# Patient Record
Sex: Female | Born: 1965 | Race: White | Hispanic: No | Marital: Single | Smoking: Current every day smoker
Health system: Southern US, Community
[De-identification: ages and names within clinical notes are randomized; demographics above are authoritative.]

## PROBLEM LIST (undated history)

## (undated) DIAGNOSIS — E039 Hypothyroidism, unspecified: Secondary | ICD-10-CM

## (undated) DIAGNOSIS — R51 Headache: Secondary | ICD-10-CM

## (undated) DIAGNOSIS — J189 Pneumonia, unspecified organism: Secondary | ICD-10-CM

## (undated) DIAGNOSIS — B958 Unspecified staphylococcus as the cause of diseases classified elsewhere: Secondary | ICD-10-CM

## (undated) DIAGNOSIS — I82409 Acute embolism and thrombosis of unspecified deep veins of unspecified lower extremity: Secondary | ICD-10-CM

## (undated) DIAGNOSIS — E0500 Thyrotoxicosis with diffuse goiter without thyrotoxic crisis or storm: Secondary | ICD-10-CM

## (undated) DIAGNOSIS — Z9289 Personal history of other medical treatment: Secondary | ICD-10-CM

## (undated) HISTORY — PX: TUBAL LIGATION: SHX77

## (undated) HISTORY — PX: COLON RESECTION: SHX5231

---

## 1994-02-16 HISTORY — PX: OTHER SURGICAL HISTORY: SHX169

## 2006-02-16 HISTORY — PX: APPENDECTOMY: SHX54

## 2007-02-17 DIAGNOSIS — I82409 Acute embolism and thrombosis of unspecified deep veins of unspecified lower extremity: Secondary | ICD-10-CM

## 2007-02-17 DIAGNOSIS — B958 Unspecified staphylococcus as the cause of diseases classified elsewhere: Secondary | ICD-10-CM

## 2007-02-17 HISTORY — DX: Acute embolism and thrombosis of unspecified deep veins of unspecified lower extremity: I82.409

## 2007-02-17 HISTORY — DX: Unspecified staphylococcus as the cause of diseases classified elsewhere: B95.8

## 2008-02-17 DIAGNOSIS — J189 Pneumonia, unspecified organism: Secondary | ICD-10-CM

## 2008-02-17 HISTORY — DX: Pneumonia, unspecified organism: J18.9

## 2013-01-31 ENCOUNTER — Other Ambulatory Visit: Payer: Self-pay | Admitting: Orthopedic Surgery

## 2013-02-20 ENCOUNTER — Encounter (HOSPITAL_COMMUNITY): Payer: Self-pay

## 2013-02-21 ENCOUNTER — Encounter (HOSPITAL_COMMUNITY): Payer: Self-pay

## 2013-02-21 ENCOUNTER — Encounter (HOSPITAL_COMMUNITY)
Admission: RE | Admit: 2013-02-21 | Discharge: 2013-02-21 | Disposition: A | Discharge status: Home / Self Care | Payer: Worker's Compensation | Source: Ambulatory Visit | Attending: Orthopedic Surgery | Admitting: Orthopedic Surgery

## 2013-02-21 DIAGNOSIS — Z01810 Encounter for preprocedural cardiovascular examination: Secondary | ICD-10-CM | POA: Insufficient documentation

## 2013-02-21 DIAGNOSIS — Z01818 Encounter for other preprocedural examination: Secondary | ICD-10-CM | POA: Insufficient documentation

## 2013-02-21 DIAGNOSIS — M6289 Other specified disorders of muscle: Secondary | ICD-10-CM | POA: Diagnosis present

## 2013-02-21 DIAGNOSIS — Z01812 Encounter for preprocedural laboratory examination: Secondary | ICD-10-CM | POA: Insufficient documentation

## 2013-02-21 HISTORY — DX: Headache: R51

## 2013-02-21 HISTORY — DX: Hypothyroidism, unspecified: E03.9

## 2013-02-21 HISTORY — DX: Thyrotoxicosis with diffuse goiter without thyrotoxic crisis or storm: E05.00

## 2013-02-21 HISTORY — DX: Personal history of other medical treatment: Z92.89

## 2013-02-21 HISTORY — DX: Pneumonia, unspecified organism: J18.9

## 2013-02-21 HISTORY — DX: Acute embolism and thrombosis of unspecified deep veins of unspecified lower extremity: I82.409

## 2013-02-21 HISTORY — DX: Unspecified staphylococcus as the cause of diseases classified elsewhere: B95.8

## 2013-02-21 LAB — HCG, SERUM, QUALITATIVE: PREG SERUM: NEGATIVE

## 2013-02-21 LAB — COMPREHENSIVE METABOLIC PANEL
ALBUMIN: 3.9 g/dL (ref 3.5–5.2)
ALT: 8 U/L (ref 0–35)
AST: 12 U/L (ref 0–37)
Alkaline Phosphatase: 48 U/L (ref 39–117)
BUN: 9 mg/dL (ref 6–23)
CO2: 24 mEq/L (ref 19–32)
Calcium: 9.5 mg/dL (ref 8.4–10.5)
Chloride: 102 mEq/L (ref 96–112)
Creatinine, Ser: 0.82 mg/dL (ref 0.50–1.10)
GFR calc Af Amer: >90 mL/min (ref >90)
GFR calc non Af Amer: 85 mL/min — ABNORMAL LOW (ref >90)
Glucose, Bld: 94 mg/dL (ref 70–99)
POTASSIUM: 4.9 meq/L (ref 3.7–5.3)
Sodium: 140 mEq/L (ref 137–147)
Total Bilirubin: 0.3 mg/dL (ref 0.3–1.2)
Total Protein: 7.5 g/dL (ref 6.0–8.3)

## 2013-02-21 LAB — URINALYSIS, ROUTINE W REFLEX MICROSCOPIC
BILIRUBIN URINE: NEGATIVE
Glucose, UA: NEGATIVE mg/dL
Hgb urine dipstick: NEGATIVE
Ketones, ur: NEGATIVE mg/dL
LEUKOCYTES UA: NEGATIVE
NITRITE: NEGATIVE
PH: 7 (ref 5.0–8.0)
Protein, ur: NEGATIVE mg/dL
Specific Gravity, Urine: 1.011 (ref 1.005–1.030)
Urobilinogen, UA: 1 mg/dL (ref 0.0–1.0)

## 2013-02-21 LAB — CBC WITH DIFFERENTIAL/PLATELET
BASOS PCT: 0 % (ref 0–1)
Basophils Absolute: 0 10*3/uL (ref 0.0–0.1)
Eosinophils Absolute: 0.1 10*3/uL (ref 0.0–0.7)
Eosinophils Relative: 1 % (ref 0–5)
HCT: 39.7 % (ref 36.0–46.0)
HEMOGLOBIN: 12.8 g/dL (ref 12.0–15.0)
LYMPHS ABS: 3.6 10*3/uL (ref 0.7–4.0)
Lymphocytes Relative: 35 % (ref 12–46)
MCH: 24.8 pg — ABNORMAL LOW (ref 26.0–34.0)
MCHC: 32.2 g/dL (ref 30.0–36.0)
MCV: 76.9 fL — ABNORMAL LOW (ref 78.0–100.0)
MONOS PCT: 5 % (ref 3–12)
Monocytes Absolute: 0.5 10*3/uL (ref 0.1–1.0)
NEUTROS PCT: 59 % (ref 43–77)
Neutro Abs: 6.1 10*3/uL (ref 1.7–7.7)
Platelets: 324 10*3/uL (ref 150–400)
RBC: 5.16 MIL/uL — AB (ref 3.87–5.11)
RDW: 16.6 % — ABNORMAL HIGH (ref 11.5–15.5)
WBC: 10.3 10*3/uL (ref 4.0–10.5)

## 2013-02-21 LAB — ABO/RH: ABO/RH(D): B POS

## 2013-02-21 LAB — TYPE AND SCREEN
ABO/RH(D): B POS
Antibody Screen: NEGATIVE

## 2013-02-21 LAB — PROTIME-INR
INR: 0.98 (ref 0.00–1.49)
PROTHROMBIN TIME: 12.8 s (ref 11.6–15.2)

## 2013-02-21 LAB — SURGICAL PCR SCREEN
MRSA, PCR: NEGATIVE
STAPHYLOCOCCUS AUREUS: NEGATIVE

## 2013-02-21 LAB — APTT: APTT: 30 s (ref 24–37)

## 2013-02-21 NOTE — Pre-Procedure Instructions (Signed)
Regina HeckYvonne Knight  02/21/2013   Your procedure is scheduled on:  Thursday, January 15.  [facility]/Entrance "A"at 9:00 AM.  Call this number if you have problems the morning of surgery: [phone]   Remember:   Do not eat food or drink liquids after midnight, Wednesday, January 14.   Take these medicines the morning of surgery with A SIP OF WATER: Nexium, Synthyroid.  May take Tramadol if needed.   Do not wear jewelry, make-up or nail polish.  Do not wear lotions, powders, or perfumes. You may wear deodorant.  Do not shave 48 hours prior to surgery. Men may shave face and neck.  Do not bring valuables to the hospital.  [facility] not [facility]                  for any belongings or valuables.               Contacts, dentures or bridgework may not be worn into surgery.  Leave suitcase in the car. After surgery it may be brought to your room.  For patients admitted to the hospital, discharge time is determined by your treatment team.               Patients discharged the day of surgery will not be allowed to drive home.  Name and phone number of your driver: -   Special Instructions: Shower using CHG 2 nights before surgery and the night before surgery.  If you shower the day of surgery use CHG.  Use special wash - you have one bottle of CHG for all showers.  You should use approximately 1/3 of the bottle for each shower.   Please read over the following fact sheets that you were given: Pain Booklet, Coughing and Deep Breathing and Surgical Site Infection Prevention

## 2013-02-23 NOTE — Progress Notes (Signed)
Anesthesia Chart Review:  Patient is a 48 year old female scheduled for C4-5, C5-6 ACDF on 03/02/13 by Dr. Yevette Edwardsumonski. History includes obesity, smoking, Graves' disease, hypothyroidism on levothyroxine, DVT (upper arm; associated to IV access) '09, headaches, colon resection (not specified), appendectomy, staph PNA '10, history of blood transfusion. No PCP is listed.  EKG on 02/21/13 showed NSR, possible anterior infarct (age undetermined).  Currently, there are no comparison EKGs available. No CV symptoms were documented at her PAT visit.  She has no reported history of HTN, DM, MI, or CHF.  Preoperative CXR and labs noted.   She will be evaluated by her assigned anesthesiologist on the day of surgery. If no acute changes or new CV symptoms then I would anticipate that she could proceed as planned.  Velna Ochsllison Solan Vosler, PA-C [facility]/Anesthesiology Phone [phone] 02/23/2013 2:06 PM

## 2013-03-01 MED ORDER — CEFAZOLIN SODIUM-DEXTROSE 2-3 GM-% IV SOLR
2.0000 g | INTRAVENOUS | Status: AC
  Administered 2013-03-02: 2 g via INTRAVENOUS
  Filled 2013-03-01: qty 50

## 2013-03-01 NOTE — Progress Notes (Signed)
Call to pt. & instructed on time change & arrival time to be 1100am on 03/02/2013

## 2013-03-02 ENCOUNTER — Ambulatory Visit (HOSPITAL_COMMUNITY): Payer: Worker's Compensation | Admitting: Anesthesiology

## 2013-03-02 ENCOUNTER — Inpatient Hospital Stay (HOSPITAL_COMMUNITY): Payer: Worker's Compensation

## 2013-03-02 ENCOUNTER — Encounter (HOSPITAL_COMMUNITY): Payer: Self-pay | Admitting: Anesthesiology

## 2013-03-02 ENCOUNTER — Other Ambulatory Visit: Payer: Self-pay | Admitting: Orthopedic Surgery

## 2013-03-02 ENCOUNTER — Ambulatory Visit (HOSPITAL_COMMUNITY)
Admission: RE | Admit: 2013-03-02 | Discharge: 2013-03-03 | Disposition: A | Discharge status: Home / Self Care | Payer: Worker's Compensation | Source: Ambulatory Visit | Attending: Orthopedic Surgery | Admitting: Orthopedic Surgery

## 2013-03-02 ENCOUNTER — Encounter (HOSPITAL_COMMUNITY)
Admission: RE | Disposition: A | Discharge status: Home / Self Care | Payer: Self-pay | Source: Ambulatory Visit | Attending: Orthopedic Surgery

## 2013-03-02 ENCOUNTER — Encounter (HOSPITAL_COMMUNITY): Payer: Worker's Compensation | Admitting: Vascular Surgery

## 2013-03-02 DIAGNOSIS — E039 Hypothyroidism, unspecified: Secondary | ICD-10-CM | POA: Insufficient documentation

## 2013-03-02 DIAGNOSIS — F17200 Nicotine dependence, unspecified, uncomplicated: Secondary | ICD-10-CM | POA: Insufficient documentation

## 2013-03-02 DIAGNOSIS — M4712 Other spondylosis with myelopathy, cervical region: Principal | ICD-10-CM | POA: Insufficient documentation

## 2013-03-02 DIAGNOSIS — Z86718 Personal history of other venous thrombosis and embolism: Secondary | ICD-10-CM | POA: Insufficient documentation

## 2013-03-02 DIAGNOSIS — E0500 Thyrotoxicosis with diffuse goiter without thyrotoxic crisis or storm: Secondary | ICD-10-CM | POA: Insufficient documentation

## 2013-03-02 DIAGNOSIS — M5410 Radiculopathy, site unspecified: Secondary | ICD-10-CM | POA: Diagnosis present

## 2013-03-02 HISTORY — PX: ANTERIOR CERVICAL DECOMP/DISCECTOMY FUSION: SHX1161

## 2013-03-02 SURGERY — ANTERIOR CERVICAL DECOMPRESSION/DISCECTOMY FUSION 2 LEVELS
Anesthesia: General | Site: Spine Cervical

## 2013-03-02 MED ORDER — SODIUM CHLORIDE 0.9 % IJ SOLN
3.0000 mL | Freq: Two times a day (BID) | INTRAMUSCULAR | Status: DC
  Administered 2013-03-02: 3 mL via INTRAVENOUS

## 2013-03-02 MED ORDER — SODIUM CHLORIDE 0.9 % IJ SOLN: 3.0000 mL | INTRAMUSCULAR | Status: DC | PRN

## 2013-03-02 MED ORDER — OXYCODONE-ACETAMINOPHEN 5-325 MG PO TABS
ORAL_TABLET | ORAL | Status: AC
  Filled 2013-03-02: qty 2

## 2013-03-02 MED ORDER — BUPIVACAINE-EPINEPHRINE 0.25% -1:200000 IJ SOLN
INTRAMUSCULAR | Status: DC | PRN
  Administered 2013-03-02: 2 mL

## 2013-03-02 MED ORDER — OXYCODONE-ACETAMINOPHEN 5-325 MG PO TABS
1.0000 | ORAL_TABLET | ORAL | Status: DC | PRN
  Administered 2013-03-02 – 2013-03-03 (×5): 2 via ORAL
  Filled 2013-03-02 (×4): qty 2

## 2013-03-02 MED ORDER — SENNOSIDES-DOCUSATE SODIUM 8.6-50 MG PO TABS
1.0000 | ORAL_TABLET | Freq: Every evening | ORAL | Status: DC | PRN
  Filled 2013-03-02: qty 1

## 2013-03-02 MED ORDER — HYDROMORPHONE HCL PF 1 MG/ML IJ SOLN
0.2500 mg | INTRAMUSCULAR | Status: DC | PRN
  Administered 2013-03-02: 0.5 mg via INTRAVENOUS

## 2013-03-02 MED ORDER — FLEET ENEMA 7-19 GM/118ML RE ENEM: 1.0000 | ENEMA | Freq: Once | RECTAL | Status: AC | PRN

## 2013-03-02 MED ORDER — DIAZEPAM 5 MG PO TABS
5.0000 mg | ORAL_TABLET | Freq: Four times a day (QID) | ORAL | Status: DC | PRN
  Administered 2013-03-02 – 2013-03-03 (×3): 5 mg via ORAL
  Filled 2013-03-02 (×2): qty 1

## 2013-03-02 MED ORDER — BUPIVACAINE-EPINEPHRINE (PF) 0.25% -1:200000 IJ SOLN
INTRAMUSCULAR | Status: AC
  Filled 2013-03-02: qty 30

## 2013-03-02 MED ORDER — MENTHOL 3 MG MT LOZG
1.0000 | LOZENGE | OROMUCOSAL | Status: DC | PRN
  Administered 2013-03-03: 3 mg via ORAL
  Filled 2013-03-02 (×2): qty 9

## 2013-03-02 MED ORDER — LEVOTHYROXINE SODIUM 150 MCG PO TABS
150.0000 ug | ORAL_TABLET | Freq: Every day | ORAL | Status: DC
  Administered 2013-03-03: 150 ug via ORAL
  Filled 2013-03-02 (×2): qty 1

## 2013-03-02 MED ORDER — POVIDONE-IODINE 7.5 % EX SOLN
Freq: Once | CUTANEOUS | Status: DC
  Filled 2013-03-02: qty 118

## 2013-03-02 MED ORDER — DOCUSATE SODIUM 100 MG PO CAPS
100.0000 mg | ORAL_CAPSULE | Freq: Two times a day (BID) | ORAL | Status: DC
  Administered 2013-03-02 (×2): 100 mg via ORAL
  Filled 2013-03-02 (×4): qty 1

## 2013-03-02 MED ORDER — CEFAZOLIN SODIUM 1-5 GM-% IV SOLN
1.0000 g | Freq: Three times a day (TID) | INTRAVENOUS | Status: AC
  Administered 2013-03-02 – 2013-03-03 (×2): 1 g via INTRAVENOUS
  Filled 2013-03-02 (×2): qty 50

## 2013-03-02 MED ORDER — GLYCOPYRROLATE 0.2 MG/ML IJ SOLN
INTRAMUSCULAR | Status: DC | PRN
  Administered 2013-03-02: 0.6 mg via INTRAVENOUS

## 2013-03-02 MED ORDER — LACTATED RINGERS IV SOLN
INTRAVENOUS | Status: DC
  Administered 2013-03-02: 11:00:00 via INTRAVENOUS

## 2013-03-02 MED ORDER — LIDOCAINE HCL (CARDIAC) 20 MG/ML IV SOLN
INTRAVENOUS | Status: DC | PRN
  Administered 2013-03-02: 100 mg via INTRAVENOUS

## 2013-03-02 MED ORDER — MIDAZOLAM HCL 5 MG/5ML IJ SOLN
INTRAMUSCULAR | Status: DC | PRN
  Administered 2013-03-02: 2 mg via INTRAVENOUS

## 2013-03-02 MED ORDER — ZOLPIDEM TARTRATE 5 MG PO TABS
5.0000 mg | ORAL_TABLET | Freq: Every evening | ORAL | Status: DC | PRN
Start: 2013-03-02 — End: 2013-03-03

## 2013-03-02 MED ORDER — THROMBIN 20000 UNITS EX SOLR
CUTANEOUS | Status: AC
  Filled 2013-03-02: qty 20000

## 2013-03-02 MED ORDER — ONDANSETRON HCL 4 MG/2ML IJ SOLN: 4.0000 mg | Freq: Once | INTRAMUSCULAR | Status: DC | PRN

## 2013-03-02 MED ORDER — FENTANYL CITRATE 0.05 MG/ML IJ SOLN
INTRAMUSCULAR | Status: DC | PRN
  Administered 2013-03-02 (×2): 50 ug via INTRAVENOUS
  Administered 2013-03-02: 150 ug via INTRAVENOUS
  Administered 2013-03-02 (×2): 50 ug via INTRAVENOUS

## 2013-03-02 MED ORDER — ACETAMINOPHEN 325 MG PO TABS: 650.0000 mg | ORAL_TABLET | ORAL | Status: DC | PRN

## 2013-03-02 MED ORDER — BISACODYL 5 MG PO TBEC
5.0000 mg | DELAYED_RELEASE_TABLET | Freq: Every day | ORAL | Status: DC | PRN
  Filled 2013-03-02: qty 1

## 2013-03-02 MED ORDER — PHENOL 1.4 % MT LIQD
1.0000 | OROMUCOSAL | Status: DC | PRN
  Administered 2013-03-02: 1 via OROMUCOSAL

## 2013-03-02 MED ORDER — ONDANSETRON HCL 4 MG/2ML IJ SOLN
INTRAMUSCULAR | Status: DC | PRN
  Administered 2013-03-02: 4 mg via INTRAVENOUS

## 2013-03-02 MED ORDER — SODIUM CHLORIDE 0.9 % IV SOLN: 250.0000 mL | INTRAVENOUS | Status: DC

## 2013-03-02 MED ORDER — HYDROMORPHONE HCL PF 1 MG/ML IJ SOLN
INTRAMUSCULAR | Status: AC
  Filled 2013-03-02: qty 1

## 2013-03-02 MED ORDER — ROCURONIUM BROMIDE 100 MG/10ML IV SOLN
INTRAVENOUS | Status: DC | PRN
  Administered 2013-03-02: 50 mg via INTRAVENOUS

## 2013-03-02 MED ORDER — PROPOFOL 10 MG/ML IV BOLUS
INTRAVENOUS | Status: DC | PRN
  Administered 2013-03-02: 50 mg via INTRAVENOUS
  Administered 2013-03-02: 150 mg via INTRAVENOUS

## 2013-03-02 MED ORDER — THROMBIN 20000 UNITS EX SOLR
CUTANEOUS | Status: DC | PRN
  Administered 2013-03-02: 12:00:00

## 2013-03-02 MED ORDER — DIAZEPAM 5 MG PO TABS
ORAL_TABLET | ORAL | Status: AC
  Filled 2013-03-02: qty 1

## 2013-03-02 MED ORDER — 0.9 % SODIUM CHLORIDE (POUR BTL) OPTIME
TOPICAL | Status: DC | PRN
  Administered 2013-03-02: 1000 mL

## 2013-03-02 MED ORDER — IRBESARTAN 300 MG PO TABS
300.0000 mg | ORAL_TABLET | Freq: Every day | ORAL | Status: DC
  Filled 2013-03-02 (×2): qty 1

## 2013-03-02 MED ORDER — ONDANSETRON HCL 4 MG/2ML IJ SOLN: 4.0000 mg | INTRAMUSCULAR | Status: DC | PRN

## 2013-03-02 MED ORDER — PANTOPRAZOLE SODIUM 40 MG PO TBEC
80.0000 mg | DELAYED_RELEASE_TABLET | Freq: Every day | ORAL | Status: DC
  Administered 2013-03-02: 80 mg via ORAL

## 2013-03-02 MED ORDER — MORPHINE SULFATE 2 MG/ML IJ SOLN
1.0000 mg | INTRAMUSCULAR | Status: DC | PRN
  Administered 2013-03-02 – 2013-03-03 (×3): 4 mg via INTRAVENOUS
  Filled 2013-03-02 (×3): qty 2

## 2013-03-02 MED ORDER — ALUM & MAG HYDROXIDE-SIMETH 200-200-20 MG/5ML PO SUSP
30.0000 mL | Freq: Four times a day (QID) | ORAL | Status: DC | PRN
Start: 2013-03-02 — End: 2013-03-03

## 2013-03-02 MED ORDER — NEOSTIGMINE METHYLSULFATE 1 MG/ML IJ SOLN
INTRAMUSCULAR | Status: DC | PRN
  Administered 2013-03-02: 3 mg via INTRAVENOUS

## 2013-03-02 MED ORDER — VITAMIN D3 25 MCG (1000 UNIT) PO TABS
1000.0000 [IU] | ORAL_TABLET | Freq: Every day | ORAL | Status: DC
  Administered 2013-03-02: 1000 [IU] via ORAL
  Filled 2013-03-02 (×2): qty 1

## 2013-03-02 MED ORDER — VITAMIN D 1000 UNITS PO CAPS: ORAL_CAPSULE | Freq: Once | ORAL | Status: DC

## 2013-03-02 MED ORDER — ACETAMINOPHEN 650 MG RE SUPP: 650.0000 mg | RECTAL | Status: DC | PRN

## 2013-03-02 SURGICAL SUPPLY — 76 items
BENZOIN TINCTURE PRP APPL 2/3 (GAUZE/BANDAGES/DRESSINGS) ×3 IMPLANT
BIT DRILL NEURO 2X3.1 SFT TUCH (MISCELLANEOUS) ×1 IMPLANT
BIT DRILL SRG 14X2.2XFLT CHK (BIT) ×1 IMPLANT
BIT DRL SRG 14X2.2XFLT CHK (BIT) ×1
BLADE SURG 15 STRL LF DISP TIS (BLADE) ×1 IMPLANT
BLADE SURG 15 STRL SS (BLADE) ×2
BLADE SURG ROTATE 9660 (MISCELLANEOUS) ×3 IMPLANT
BUR MATCHSTICK NEURO 3.0 LAGG (BURR) ×3 IMPLANT
CARTRIDGE OIL MAESTRO DRILL (MISCELLANEOUS) ×1 IMPLANT
CLOSURE STERI-STRIP 1/2X4 (GAUZE/BANDAGES/DRESSINGS) ×1
CLOSURE WOUND 1/2 X4 (GAUZE/BANDAGES/DRESSINGS) ×1
CLOTH BEACON ORANGE TIMEOUT ST (SAFETY) ×3 IMPLANT
CLSR STERI-STRIP ANTIMIC 1/2X4 (GAUZE/BANDAGES/DRESSINGS) ×2 IMPLANT
COLLAR CERV LO CONTOUR FIRM DE (SOFTGOODS) IMPLANT
CORDS BIPOLAR (ELECTRODE) ×3 IMPLANT
COVER SURGICAL LIGHT HANDLE (MISCELLANEOUS) ×3 IMPLANT
CRADLE DONUT ADULT HEAD (MISCELLANEOUS) ×3 IMPLANT
DIFFUSER DRILL AIR PNEUMATIC (MISCELLANEOUS) ×3 IMPLANT
DRAIN JACKSON RD 7FR 3/32 (WOUND CARE) IMPLANT
DRAPE C-ARM 42X72 X-RAY (DRAPES) ×3 IMPLANT
DRAPE POUCH INSTRU U-SHP 10X18 (DRAPES) ×3 IMPLANT
DRAPE SURG 17X23 STRL (DRAPES) ×9 IMPLANT
DRILL BIT SKYLINE 14MM (BIT) ×2
DRILL NEURO 2X3.1 SOFT TOUCH (MISCELLANEOUS) ×3
DURAPREP 26ML APPLICATOR (WOUND CARE) ×3 IMPLANT
ELECT COATED BLADE 2.86 ST (ELECTRODE) ×3 IMPLANT
ELECT REM PT RETURN 9FT ADLT (ELECTROSURGICAL) ×3
ELECTRODE REM PT RTRN 9FT ADLT (ELECTROSURGICAL) ×1 IMPLANT
ENDOSKELETON IMPLANT MED 6M-0 (Orthopedic Implant) ×3 IMPLANT
EVACUATOR SILICONE 100CC (DRAIN) IMPLANT
GAUZE SPONGE 4X4 16PLY XRAY LF (GAUZE/BANDAGES/DRESSINGS) ×3 IMPLANT
GLOVE BIO SURGEON STRL SZ7 (GLOVE) ×3 IMPLANT
GLOVE BIO SURGEON STRL SZ8 (GLOVE) ×3 IMPLANT
GLOVE BIOGEL PI IND STRL 7.5 (GLOVE) ×2 IMPLANT
GLOVE BIOGEL PI IND STRL 8 (GLOVE) ×1 IMPLANT
GLOVE BIOGEL PI INDICATOR 7.5 (GLOVE) ×4
GLOVE BIOGEL PI INDICATOR 8 (GLOVE) ×2
GOWN STRL NON-REIN LRG LVL3 (GOWN DISPOSABLE) ×3 IMPLANT
GOWN STRL REIN XL XLG (GOWN DISPOSABLE) ×3 IMPLANT
IMPL S ENDOSKEL TC 8MM ODEG (Orthopedic Implant) ×1 IMPLANT
IMPLANT S ENDOSKEL TC 8MM ODEG (Orthopedic Implant) ×3 IMPLANT
IV CATH 14GX2 1/4 (CATHETERS) ×3 IMPLANT
KIT BASIN OR (CUSTOM PROCEDURE TRAY) ×3 IMPLANT
KIT ROOM TURNOVER OR (KITS) ×3 IMPLANT
MANIFOLD NEPTUNE II (INSTRUMENTS) ×3 IMPLANT
NEEDLE 27GAX1X1/2 (NEEDLE) ×3 IMPLANT
NEEDLE SPNL 20GX3.5 QUINCKE YW (NEEDLE) ×3 IMPLANT
NS IRRIG 1000ML POUR BTL (IV SOLUTION) ×3 IMPLANT
OIL CARTRIDGE MAESTRO DRILL (MISCELLANEOUS) ×3
PACK ORTHO CERVICAL (CUSTOM PROCEDURE TRAY) ×3 IMPLANT
PAD ARMBOARD 7.5X6 YLW CONV (MISCELLANEOUS) ×6 IMPLANT
PATTIES SURGICAL .5 X.5 (GAUZE/BANDAGES/DRESSINGS) ×3 IMPLANT
PATTIES SURGICAL .5 X1 (DISPOSABLE) ×3 IMPLANT
PIN DISTRACTION 14 (PIN) ×6 IMPLANT
PLATE SKYLINE TWO LEVEL 32MM (Plate) ×3 IMPLANT
PUTTY BONE DBX 2.5 MIS (Bone Implant) ×3 IMPLANT
SCREW VAR SELF TAP SKYLINE 14M (Screw) ×18 IMPLANT
SPONGE GAUZE 4X4 12PLY (GAUZE/BANDAGES/DRESSINGS) ×3 IMPLANT
SPONGE GAUZE 4X4 12PLY STER LF (GAUZE/BANDAGES/DRESSINGS) ×3 IMPLANT
SPONGE INTESTINAL PEANUT (DISPOSABLE) ×3 IMPLANT
SPONGE SURGIFOAM ABS GEL 100 (HEMOSTASIS) ×3 IMPLANT
STRIP CLOSURE SKIN 1/2X4 (GAUZE/BANDAGES/DRESSINGS) ×2 IMPLANT
SURGIFLO TRUKIT (HEMOSTASIS) IMPLANT
SUT MNCRL AB 4-0 PS2 18 (SUTURE) ×3 IMPLANT
SUT SILK 4 0 (SUTURE)
SUT SILK 4-0 18XBRD TIE 12 (SUTURE) IMPLANT
SUT VIC AB 2-0 CT2 18 VCP726D (SUTURE) ×3 IMPLANT
SYR BULB IRRIGATION 50ML (SYRINGE) ×3 IMPLANT
SYR CONTROL 10ML LL (SYRINGE) ×6 IMPLANT
TAPE CLOTH 4X10 WHT NS (GAUZE/BANDAGES/DRESSINGS) ×3 IMPLANT
TAPE CLOTH SURG 4X10 WHT LF (GAUZE/BANDAGES/DRESSINGS) ×3 IMPLANT
TAPE UMBILICAL COTTON 1/8X30 (MISCELLANEOUS) ×3 IMPLANT
TOWEL OR 17X24 6PK STRL BLUE (TOWEL DISPOSABLE) ×3 IMPLANT
TOWEL OR 17X26 10 PK STRL BLUE (TOWEL DISPOSABLE) ×3 IMPLANT
WATER STERILE IRR 1000ML POUR (IV SOLUTION) IMPLANT
YANKAUER SUCT BULB TIP NO VENT (SUCTIONS) ×3 IMPLANT

## 2013-03-02 NOTE — Preoperative (Signed)
Beta Blockers   Reason not to administer Beta Blockers:Not Applicable 

## 2013-03-02 NOTE — Transfer of Care (Signed)
Immediate Anesthesia Transfer of Care Note  Patient: Regina Knight  Procedure(s) Performed: Procedure(s) with comments: ANTERIOR CERVICAL DECOMPRESSION/DISCECTOMY FUSION 2 LEVELS (N/A) - Anterior cervical decompression fusion, cervical 4-5, cervical 5-6 with instrumentation and allograft  Patient Location: PACU  Anesthesia Type:General  Level of Consciousness: awake, sedated and patient cooperative  Airway & Oxygen Therapy: Patient Spontanous Breathing and Patient connected to face mask oxygen  Post-op Assessment: Report given to PACU RN and Post -op Vital signs reviewed and stable  Post vital signs: Reviewed and stable  Complications: No apparent anesthesia complications

## 2013-03-02 NOTE — H&P (Signed)
PREOPERATIVE H&P  Chief Complaint: bilateral arm pain  HPI: Regina Knight is a 48 y.o. female who presents with ongoing bilateral arm pain. MRI = 2 levels of foraminal stenosis correlating tot he patient pain. Patient has failed multiple forms of conservative care and wishes to proceed with surgical intervention.   Past Medical History  Diagnosis Date  . Graves disease   . Hypothyroidism     after radiation for graves  . Staph infection 2009    lungs  . Pneumonia 2010  . Headache(784.0)     Excedrine Migraine  . History of blood transfusion   . DVT (deep venous thrombosis) 2009    upper arms- multiple IVs 5 week hospitalization   Past Surgical History  Procedure Laterality Date  . Appendectomy  2008  . Colon resection    . Tubal ligation    . Tubal ligation reversal  1996   History   Social History  . Marital Status: Single    Spouse Name: N/A    Number of Children: N/A  . Years of Education: N/A   Social History Main Topics  . Smoking status: Current Every Day Smoker -- 1.00 packs/day for 30 years  . Smokeless tobacco: Not on file  . Alcohol Use: No  . Drug Use: Not on file  . Sexual Activity: Not on file   Other Topics Concern  . Not on file   Social History Narrative  . No narrative on file   No family history on file. Allergies  Allergen Reactions  . Naproxen Hives   Prior to Admission medications   Medication Sig Start Date End Date Taking? Authorizing Provider  carisoprodol (SOMA) 350 MG tablet Take 350 mg by mouth 3 (three) times daily.   Yes Historical Provider, MD  Cholecalciferol (VITAMIN D PO) Take 1 tablet by mouth daily.   Yes Historical Provider, MD  esomeprazole (NEXIUM) 40 MG capsule Take 40 mg by mouth daily at 12 noon.   Yes Historical Provider, MD  levothyroxine (SYNTHROID, LEVOTHROID) 150 MCG tablet Take 150 mcg by mouth daily before breakfast.   Yes Historical Provider, MD  traMADol (ULTRAM) 50 MG tablet Take 100 mg by mouth every 6  (six) hours as needed.   Yes Historical Provider, MD     All other systems have been reviewed and were otherwise negative with the exception of those mentioned in the HPI and as above.  Physical Exam: There were no vitals filed for this visit.  General: Alert, no acute distress Cardiovascular: No pedal edema Respiratory: No cyanosis, no use of accessory musculature GI: No organomegaly, abdomen is soft and non-tender Skin: No lesions in the area of chief complaint Neurologic: Sensation intact distally Psychiatric: Patient is competent for consent with normal mood and affect Lymphatic: No axillary or cervical lymphadenopathy   Assessment/Plan: Left > right arm pain Plan for Procedure(s): ANTERIOR CERVICAL DECOMPRESSION/DISCECTOMY FUSION 2 LEVELS   Regina Knight,Regina Shorey LEONARD, MD 03/02/2013 7:28 AM

## 2013-03-02 NOTE — Progress Notes (Signed)
Orthopedic Tech Progress Note Patient Details:  Regina HeckYvonne Knight 09/28/1965 [id number]  Ortho Devices Type of Ortho Device: Philadelphia cervical collar Ortho Device/Splint Interventions: Casandra DoffingOrdered   Aadarsh Cozort Craig 03/02/2013, 4:10 PM

## 2013-03-02 NOTE — Anesthesia Postprocedure Evaluation (Signed)
  Anesthesia Post-op Note  Patient: Duwayne HeckYvonne Barbone  Procedure(s) Performed: Procedure(s) with comments: ANTERIOR CERVICAL DECOMPRESSION/DISCECTOMY FUSION 2 LEVELS (N/A) - Anterior cervical decompression fusion, cervical 4-5, cervical 5-6 with instrumentation and allograft  Patient Location: PACU  Anesthesia Type:General  Level of Consciousness: awake, oriented, sedated and patient cooperative  Airway and Oxygen Therapy: Patient Spontanous Breathing  Post-op Pain: mild  Post-op Assessment: Post-op Vital signs reviewed, Patient's Cardiovascular Status Stable, Respiratory Function Stable, Patent Airway, No signs of Nausea or vomiting and Pain level controlled  Post-op Vital Signs: stable  Complications: No apparent anesthesia complications

## 2013-03-02 NOTE — Anesthesia Procedure Notes (Signed)
Procedure Name: Intubation Date/Time: 03/02/2013 11:36 AM Performed by: Quentin OreWALKER, Jacquette Canales E Pre-anesthesia Checklist: Patient identified, Emergency Drugs available, Suction available, Patient being monitored and Timeout performed Patient Re-evaluated:Patient Re-evaluated prior to inductionOxygen Delivery Method: Circle system utilized Preoxygenation: Pre-oxygenation with 100% oxygen Intubation Type: IV induction Ventilation: Mask ventilation without difficulty Laryngoscope Size: Mac and 3 Grade View: Grade I Tube type: Oral Tube size: 7.0 mm Number of attempts: 2 Airway Equipment and Method: Stylet Placement Confirmation: ETT inserted through vocal cords under direct vision,  positive ETCO2 and breath sounds checked- equal and bilateral Secured at: 21 cm Tube secured with: Tape Dental Injury: Teeth and Oropharynx as per pre-operative assessment

## 2013-03-02 NOTE — Anesthesia Preprocedure Evaluation (Addendum)
Anesthesia Evaluation  Patient identified by MRN, date of birth, ID band Patient awake    Reviewed: Allergy & Precautions, H&P , NPO status , Patient's Chart, lab work & pertinent test results  History of Anesthesia Complications (+) PONV  Airway Mallampati: I      Dental  (+) Teeth Intact   Pulmonary Current Smoker,          Cardiovascular     Neuro/Psych  Headaches,    GI/Hepatic   Endo/Other  Hypothyroidism   Renal/GU      Musculoskeletal   Abdominal   Peds  Hematology   Anesthesia Other Findings   Reproductive/Obstetrics                          Anesthesia Physical Anesthesia Plan  ASA: II  Anesthesia Plan: General   Post-op Pain Management:    Induction: Intravenous  Airway Management Planned: Oral ETT  Additional Equipment:   Intra-op Plan:   Post-operative Plan: Extubation in OR  Informed Consent: I have reviewed the patients History and Physical, chart, labs and discussed the procedure including the risks, benefits and alternatives for the proposed anesthesia with the patient or authorized representative who has indicated his/her understanding and acceptance.   Dental advisory given  Plan Discussed with: CRNA and Anesthesiologist  Anesthesia Plan Comments:         Anesthesia Quick Evaluation

## 2013-03-03 ENCOUNTER — Encounter (HOSPITAL_COMMUNITY): Payer: Self-pay | Admitting: Orthopedic Surgery

## 2013-03-03 MED ORDER — HYDROMORPHONE HCL 2 MG PO TABS: 1.0000 mg | ORAL_TABLET | ORAL | Status: AC | PRN

## 2013-03-03 MED ORDER — HYDROMORPHONE HCL 2 MG PO TABS
1.0000 mg | ORAL_TABLET | ORAL | Status: DC | PRN
  Administered 2013-03-03: 2 mg via ORAL
  Filled 2013-03-03: qty 1

## 2013-03-03 NOTE — Progress Notes (Signed)
Pt. discharged home accompanied by husband. Prescriptions and discharge instructions given with verbalization of understanding. Incision site on neck with no s/s of infection - no swelling, redness, bleeding, and/or drainage noted. Soft collar intact. Pain med given just before leaving. Opportunity given to ask questions but no question asked. Pt. transported out of this unit in wheelchair by the volunteer. 

## 2013-03-03 NOTE — Progress Notes (Addendum)
1 Day PO C4-6 ACDF for myeloradiculopathy. Reports resolved arm pain, mild residual numbness left arm, expected throat soreness and swallowing difficulties. Moderate posterior neck pain. Has been ambulating and tolerating soft foods. Did require several doses of breakthrough morphine overnight and is concerned about pain control. Denies N/V/SOB/HA having NL BM  BP 113/77  Pulse 87  Temp(Src) 98.8 F (37.1 C) (Oral)  Resp 16  SpO2 93%  LMP 02/14/2013 Pt ambulating in hallway upon arrival comfortably, hard collar in place, dressing CDI. TTP posterior cervical musculature. -Homans, NVI.   1 Day PO C4-6 ACDF for myeloradiculopathy   -Arm pain resolved, residual numbness not unexpected   -Expected posterior neck pain, reports previous better control with Dilaudid     -Switch Dilaudid to Valium    -D/C home on these, written scripts signed and in chart   -Expected swallowing difficulties/throat soreness   -Cont cepacol spray and lozenges, pending anesthesia to evaluate for additional reassurance   -D/C home later today pending pain control    -D/C instructions printed and in chart   -Philly Collar at bedside for showering, use after 5 days
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
## 2013-03-14 ENCOUNTER — Encounter (HOSPITAL_COMMUNITY): Payer: Self-pay | Admitting: Orthopedic Surgery

## 2013-03-14 NOTE — Addendum Note (Signed)
Addendum created 03/14/13 1348 by Laverle HobbyGregory Pleshette Tomasini, MD   Modules edited: Anesthesia Events

## 2014-06-09 IMAGING — CR DG CHEST 2V
2 series · 2 of 2 positions shown · non-contrast
Comparison: None.

CLINICAL DATA: Preoperative study, history of tobacco use.

EXAM:
CHEST  2 VIEW

[w chest pa]
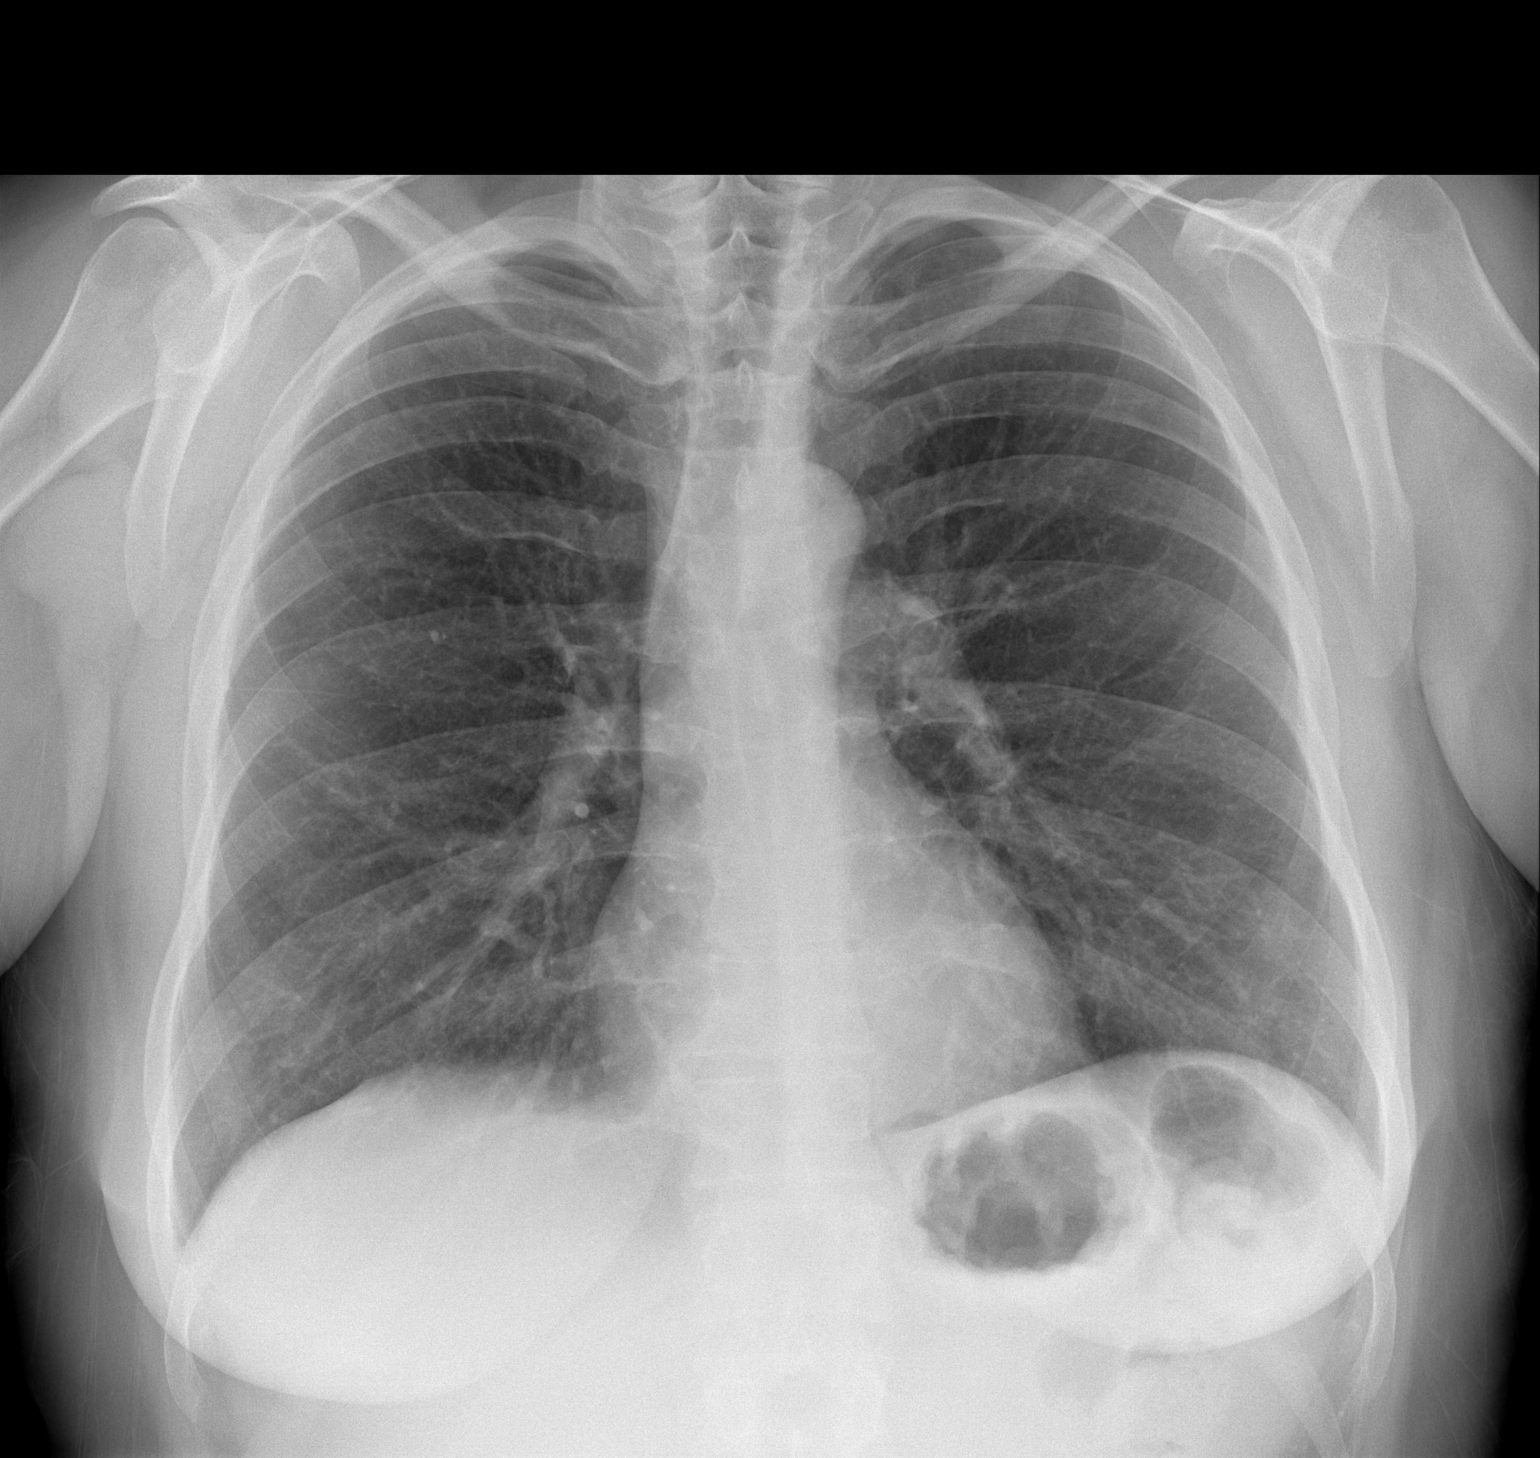

[w chest lat]
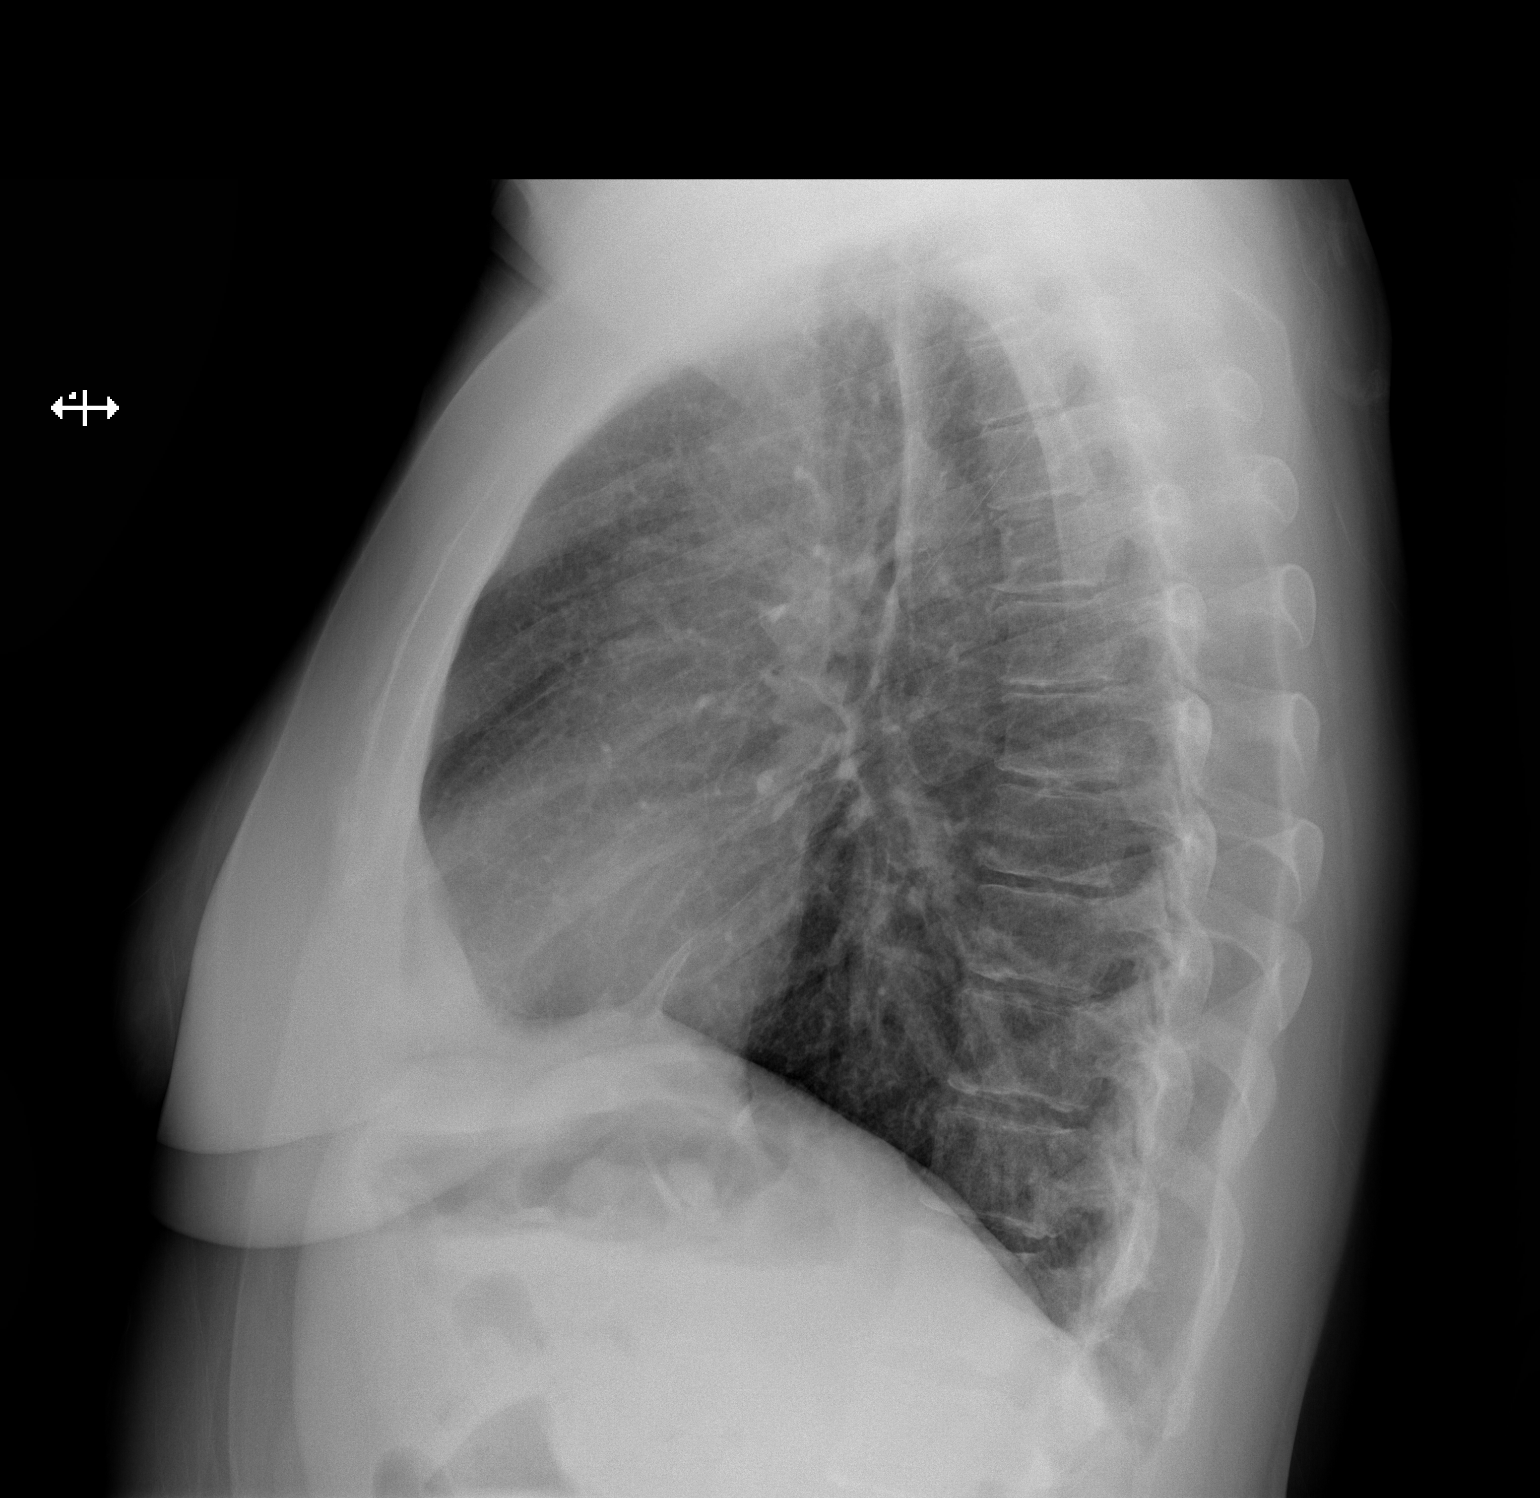

[2 of 2 positions shown; findings below may reference images not displayed]

FINDINGS: The lungs are adequately inflated. There is no focal infiltrate. The
cardiac silhouette is normal in size. The pulmonary vascularity is
not engorged. The mediastinum is normal in width. There is no
pleural effusion or pneumothorax. The observed portions of the bony
thorax appear normal.
IMPRESSION: There is no evidence of active cardiopulmonary disease.

## 2014-06-18 IMAGING — RF DG C-ARM 61-120 MIN
1 series · 1 of 1 positions shown · non-contrast
Comparison: None.

CLINICAL DATA: C4-6 ACDF.

EXAM:
DG CERVICAL SPINE - 1 VIEW; DG C-ARM 1-60 MIN

[Series 1: run · 1 of 1 slices shown]
[im 1/1]
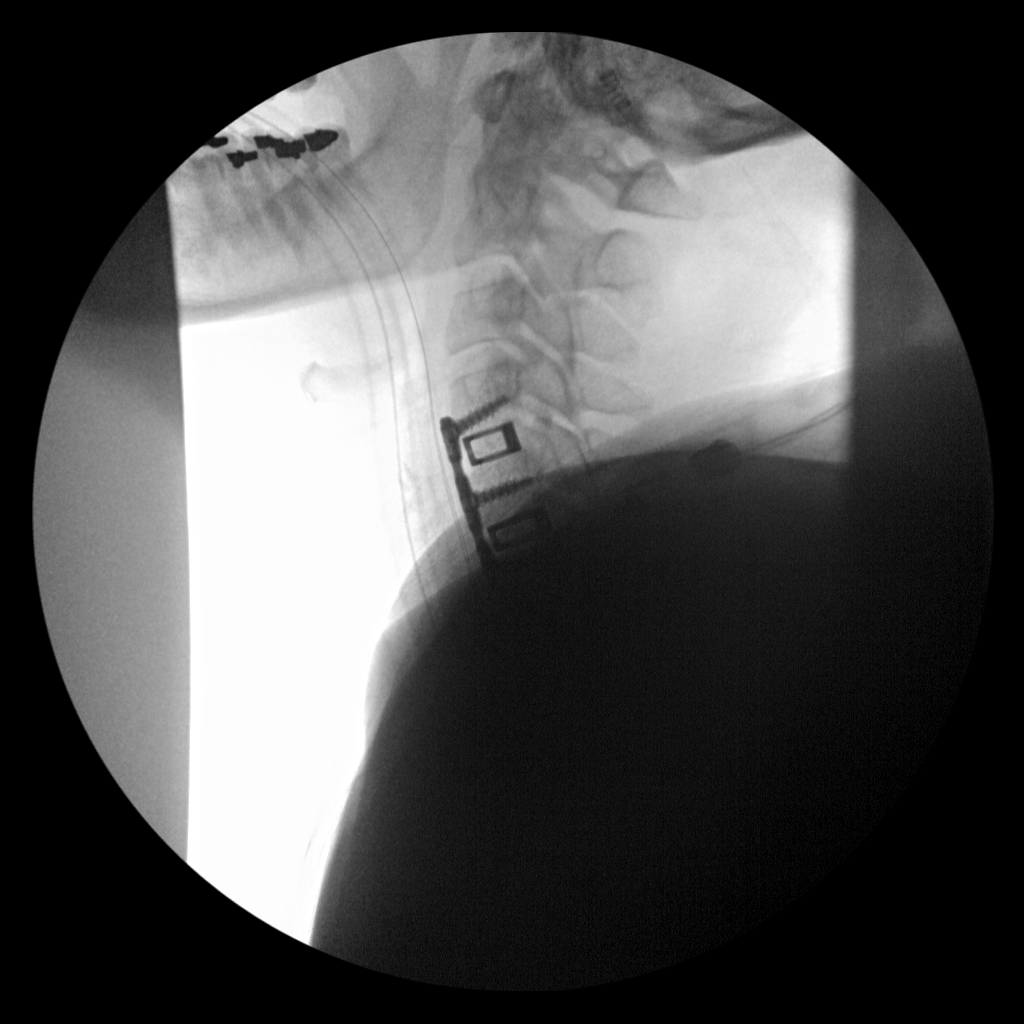

[1 of 1 positions shown; findings below may reference images not displayed]

FINDINGS: We are provided with a single fluoroscopic intraoperative spot view
of the cervical spine. Images demonstrate anterior plate and screws
and interbody spacers in place from C4-C6.
IMPRESSION: C4-6 ACDF.
